# Patient Record
Sex: Female | Born: 1945 | Race: White | Hispanic: No | Marital: Married | Smoking: Former smoker
Health system: Southern US, Community
[De-identification: ages and names within clinical notes are randomized; demographics above are authoritative.]

## PROBLEM LIST (undated history)

## (undated) DIAGNOSIS — C679 Malignant neoplasm of bladder, unspecified: Secondary | ICD-10-CM

## (undated) DIAGNOSIS — N816 Rectocele: Secondary | ICD-10-CM

## (undated) DIAGNOSIS — I10 Essential (primary) hypertension: Secondary | ICD-10-CM

## (undated) HISTORY — DX: Essential (primary) hypertension: I10

## (undated) HISTORY — DX: Malignant neoplasm of bladder, unspecified: C67.9

## (undated) HISTORY — PX: APPENDECTOMY: SHX54

## (undated) HISTORY — DX: Rectocele: N81.6

## (undated) HISTORY — PX: OTHER SURGICAL HISTORY: SHX169

---

## 2008-06-28 ENCOUNTER — Ambulatory Visit: Payer: Self-pay | Admitting: Cardiology

## 2011-10-24 ENCOUNTER — Encounter (INDEPENDENT_AMBULATORY_CARE_PROVIDER_SITE_OTHER): Payer: Self-pay | Admitting: *Deleted

## 2011-11-07 ENCOUNTER — Ambulatory Visit (INDEPENDENT_AMBULATORY_CARE_PROVIDER_SITE_OTHER): Payer: Self-pay | Admitting: Internal Medicine

## 2015-11-08 ENCOUNTER — Ambulatory Visit (HOSPITAL_COMMUNITY)
Admission: RE | Admit: 2015-11-08 | Discharge: 2015-11-08 | Disposition: A | Discharge status: Home / Self Care | Payer: Commercial Managed Care - HMO | Source: Ambulatory Visit | Attending: Family Medicine | Admitting: Family Medicine

## 2015-11-08 ENCOUNTER — Other Ambulatory Visit (HOSPITAL_COMMUNITY): Payer: Self-pay | Admitting: Family Medicine

## 2015-11-08 DIAGNOSIS — M50322 Other cervical disc degeneration at C5-C6 level: Secondary | ICD-10-CM | POA: Diagnosis not present

## 2015-11-08 DIAGNOSIS — M542 Cervicalgia: Secondary | ICD-10-CM

## 2016-12-11 ENCOUNTER — Ambulatory Visit (INDEPENDENT_AMBULATORY_CARE_PROVIDER_SITE_OTHER): Payer: Medicare HMO | Admitting: Urology

## 2016-12-11 ENCOUNTER — Other Ambulatory Visit (HOSPITAL_COMMUNITY)
Admission: RE | Admit: 2016-12-11 | Discharge: 2016-12-11 | Disposition: A | Discharge status: Home / Self Care | Payer: Commercial Managed Care - HMO | Source: Ambulatory Visit | Attending: Urology | Admitting: Urology

## 2016-12-11 DIAGNOSIS — C678 Malignant neoplasm of overlapping sites of bladder: Secondary | ICD-10-CM | POA: Diagnosis present

## 2017-04-05 IMAGING — DX DG CERVICAL SPINE COMPLETE 4+V
5 series · 5 of 5 positions shown · non-contrast
Comparison: None.

CLINICAL DATA: Neck pain for the past 2 months after getting Bodrul Razvan
Ceejay.

EXAM:
CERVICAL SPINE - COMPLETE 4+ VIEW

[c-spine lat]
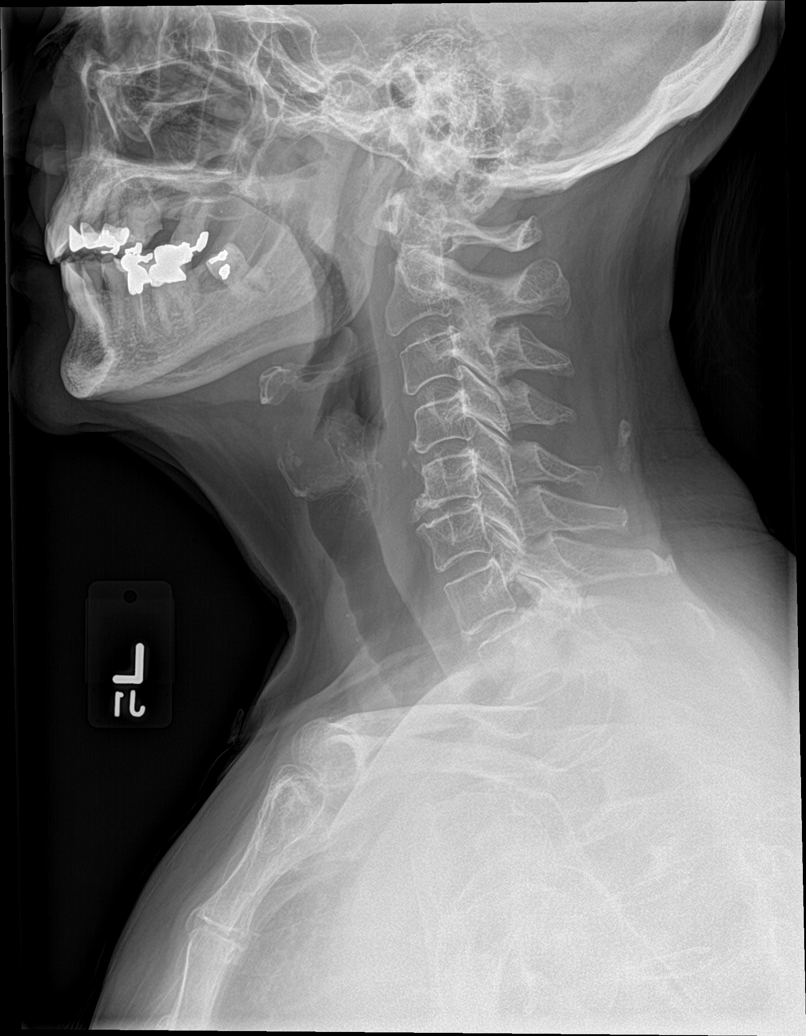

[c-spine obl (1 of 2)]
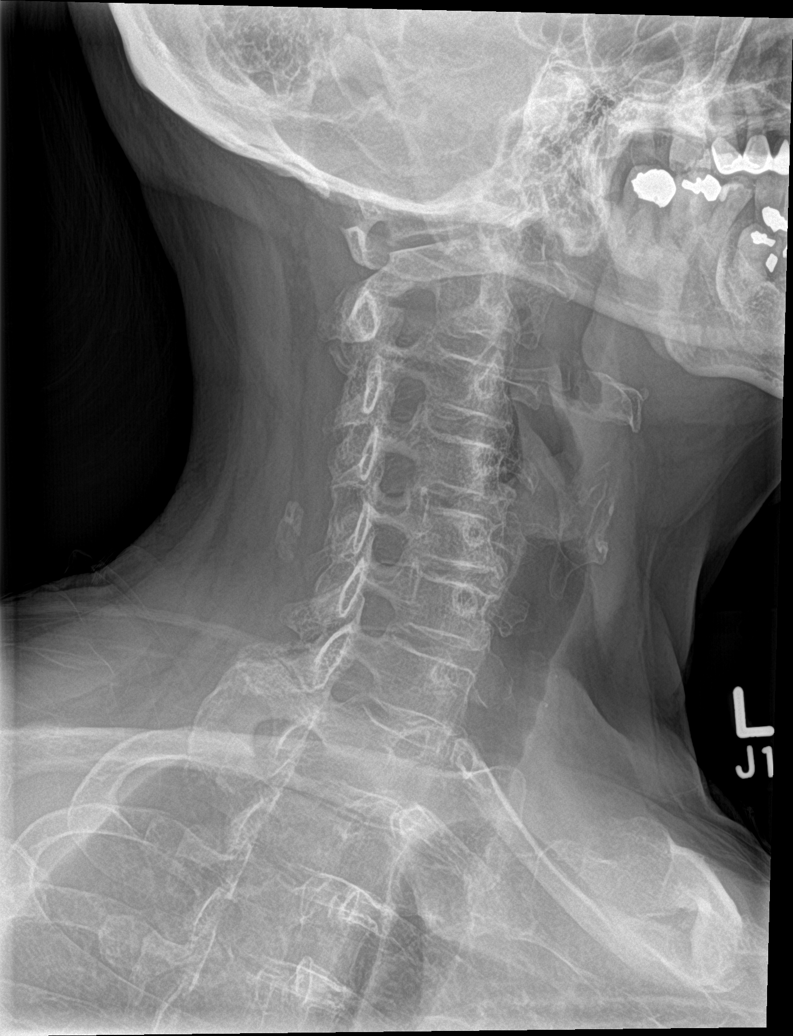

[c-spine obl (2 of 2)]
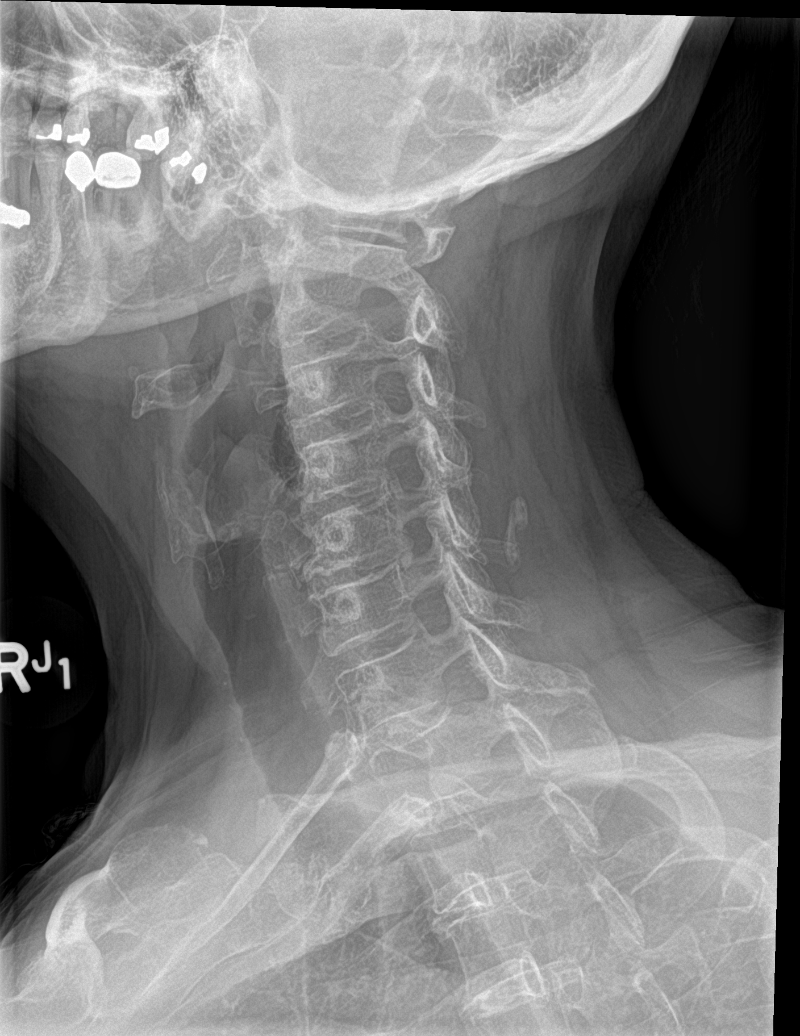

[c-spine ap]
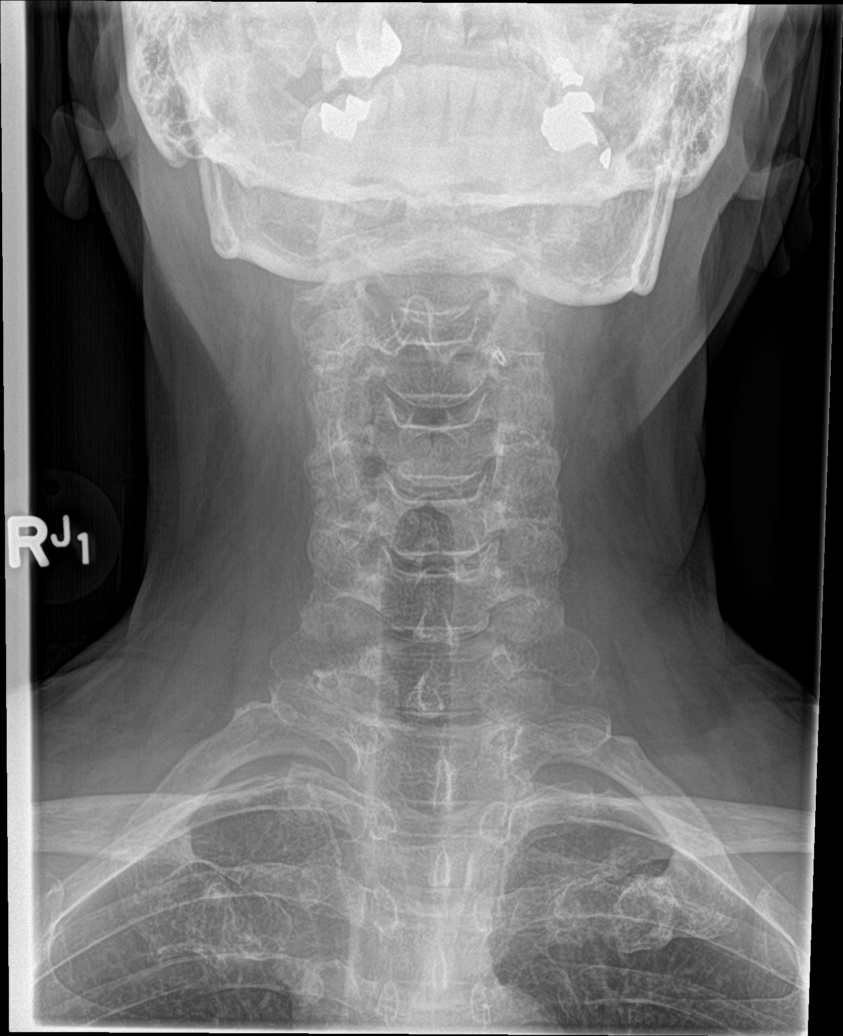

[c-spine open mouth]
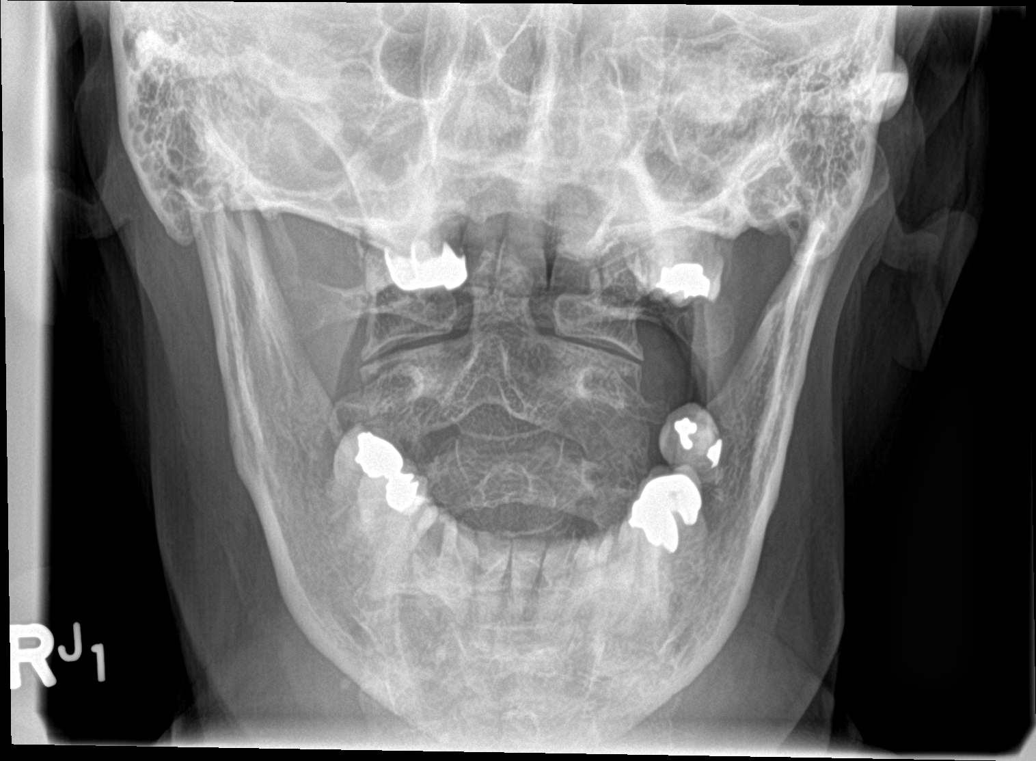

[5 of 5 positions shown; findings below may reference images not displayed]

FINDINGS: C1 to the superior endplate of T1 is imaged on the provided lateral
radiograph.

Normal alignment of the cervical spine. No anterolisthesis or
retrolisthesis. The bilateral neural foramina appear widely patent.
The dens is normally positioned between the lateral masses of C1.

Cervical vertebral body heights are preserved. Prevertebral soft
tissues are normal.

Mild to moderate multilevel cervical spine DDD, worse at C5-C6 and
to a lesser extent, C4-C5 with disc space height loss, endplate
irregularity and sclerosis.

There is partial ossification of the nuchal ligament posterior to
the C4 and C5 spinous processes. Regional soft tissues appear
otherwise normal.

Limited visualization of the lung apices is normal.
IMPRESSION: 1. No acute findings.
2. Mild-to-moderate multilevel cervical spine DDD, worse at C5-C6.

## 2018-05-01 ENCOUNTER — Ambulatory Visit: Payer: Medicare PPO | Admitting: Adult Health

## 2018-05-01 ENCOUNTER — Encounter: Payer: Self-pay | Admitting: Adult Health

## 2018-05-01 VITALS — BP 179/72 | HR 66 | Ht 63.25 in | Wt 180.0 lb

## 2018-05-01 DIAGNOSIS — N898 Other specified noninflammatory disorders of vagina: Secondary | ICD-10-CM | POA: Diagnosis not present

## 2018-05-01 DIAGNOSIS — L900 Lichen sclerosus et atrophicus: Secondary | ICD-10-CM | POA: Diagnosis not present

## 2018-05-01 MED ORDER — CLOBETASOL PROPIONATE 0.05 % EX CREA: TOPICAL_CREAM | CUTANEOUS | 3 refills | Status: DC

## 2018-05-01 NOTE — Patient Instructions (Signed)
Lichen Sclerosus  Lichen sclerosus is a skin problem. It can happen on any part of the body, but it commonly involves the anal or genital areas. It can cause itching and discomfort in these areas. Treatment can help to control symptoms. When the genital area is affected, getting treatment is important because the condition can cause scarring that may lead to other problems.  What are the causes?  The cause of this condition is not known. It could be the result of an overactive immune system or a lack of certain hormones. Lichen sclerosus is not an infection or a fungus. It is not passed from one person to another (not contagious).  What increases the risk?  This condition is more likely to develop in women, usually after menopause.  What are the signs or symptoms?  Symptoms of this condition include:  · Thin, wrinkled, white areas on the skin.  · Thickened white areas on the skin.  · Red and swollen patches (lesions) on the skin.  · Tears or cracks in the skin.  · Bruising.  · Blood blisters.  · Severe itching.    You may also have pain, itching, or burning with urination. Constipation is also common in people with lichen sclerosus.  How is this diagnosed?  This condition may be diagnosed with a physical exam. In some cases, a tissue sample (biopsy sample) may be removed to be looked at under a microscope.  How is this treated?  This condition is usually treated with medicated creams or ointments (topical steroids) that are applied over the affected areas.  Follow these instructions at home:  · Take over-the-counter and prescription medicines only as told by your health care provider.  · Use creams or ointments as told by your health care provider.  · Do not scratch the affected areas of skin.  · Women should keep the vaginal area as clean and dry as possible.  · Keep all follow-up visits as told by your health care provider. This is important.  Contact a health care provider if:  · You have increasing redness,  swelling, or pain in the affected area.  · You have fluid, blood, or pus coming from the affected area.  · You have new lesions on your skin.  · You have a fever.  · You have pain during sex.  This information is not intended to replace advice given to you by your health care provider. Make sure you discuss any questions you have with your health care provider.  Document Released: 12/12/2010 Document Revised: 12/28/2015 Document Reviewed: 10/17/2014  Elsevier Interactive Patient Education © 2018 [employer]

## 2018-05-01 NOTE — Progress Notes (Signed)
  Subjective:     Patient ID: Jody Adams, female   DOB: 21-Oct-1945, 72 y.o.   MRN: [id number]  HPI Jody Adams is a 72 year old white female, PM in complaining of vaginal itching for about 2 months. PCP is Dr Karie Kirks.   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 Reviewed medications and allergies.     Objective:   Physical Exam BP (!) 177/86 (BP Location: Left Arm, Patient Position: Sitting, Cuff Size: Normal)   Pulse 66   Ht 5' 3.25" (1.607 m)   Wt 180 lb (81.6 kg)   BMI 31.63 kg/m PHQ 2 score .BP recheck 179/72.  Skin warm and dry.Pelvic: external genitalia, has thin dry white area, at top of clitoral area, vagina:pale with loss of moisture and rugae,has thin urethra has no lesions or masses noted, cervix:smooth, uterus: normal size, shape and contour, non tender, no masses felt, adnexa: no masses or tenderness noted. Bladder is non tender and no masses felt.   On rectal exam hs good tone, +rectocele. F/U with Dr Karie Kirks in 1 month, and get BP rechecked, she has it is white coat syndrome.  Assessment:     1. Lichen sclerosus et atrophicus   2. Vaginal itching       Plan:     Meds ordered this encounter  Medications  . clobetasol cream (TEMOVATE) 0.05 %    Sig: Use bid for 2 weeks then 3 x weekly    Dispense:  30 g    Refill:  3    Order Specific Question:   Supervising Provider    Answer:   Tania Ade H [[id number]]  Recheck in 4 weeks, if not better will get biopsy Review handout on Lichen sclerosus, and showed pictures in [facility].  Try eating 1/2 cup applesauce, 1/2 cup oatmeal and 1/2 prune juice every day tp keep stool soft.

## 2018-05-29 ENCOUNTER — Ambulatory Visit: Payer: Medicare PPO | Admitting: Adult Health

## 2018-05-29 ENCOUNTER — Encounter: Payer: Self-pay | Admitting: Adult Health

## 2018-05-29 VITALS — BP 172/81 | HR 71 | Ht 63.5 in | Wt 180.0 lb

## 2018-05-29 DIAGNOSIS — L900 Lichen sclerosus et atrophicus: Secondary | ICD-10-CM

## 2018-05-29 NOTE — Progress Notes (Signed)
  Subjective:     Patient ID: Jody Adams, female   DOB: April 22, 1946, 72 y.o.   MRN: [id number]  HPI Jody Adams is a 72 year old white female, back in follow up on vaginal itching.and it has resolved. PCP is Dr Karie Kirks.   Review of Systems Itching stopped after 2 days of using temovate, and skin feels smoother Reviewed past medical,surgical, social and family history. Reviewed medications and allergies.     Objective:   Physical Exam BP (!) 172/81 (BP Location: Left Arm, Patient Position: Sitting, Cuff Size: Normal)   Pulse 71   Ht 5' 3.5" (1.613 m)   Wt 180 lb (81.6 kg)   BMI 31.39 kg/m   Skin warm and dry, the area that was white at top of clitoris has resolved and skin is smoother.The tissues are thin. Continue temovate 2-3 x weekly just a thin layer.     Assessment:    LSA    Plan:     Continue temovate 2-3 x weekly F/U in 10 weeks

## 2018-08-07 ENCOUNTER — Ambulatory Visit: Payer: Medicare PPO | Admitting: Adult Health

## 2018-08-12 ENCOUNTER — Encounter: Payer: Self-pay | Admitting: Adult Health

## 2018-08-12 ENCOUNTER — Ambulatory Visit (INDEPENDENT_AMBULATORY_CARE_PROVIDER_SITE_OTHER): Payer: Medicare Other | Admitting: Adult Health

## 2018-08-12 VITALS — BP 160/87 | HR 68 | Ht 63.5 in | Wt 185.0 lb

## 2018-08-12 DIAGNOSIS — L900 Lichen sclerosus et atrophicus: Secondary | ICD-10-CM

## 2018-08-12 NOTE — Progress Notes (Signed)
Patient ID: Jody Adams, female   DOB: 1946/07/27, 73 y.o.   MRN: [id number] History of Present Illness:  Jody Adams is a 73 year old white female, back in follow up on using temovate for LSA and has not itching. PCP is now Dr Harmon Pier, changed insurance, was Dr Karie Kirks.   Current Medications, Allergies, Past Medical History, Past Surgical History, Family History and Social History were reviewed in [employer] record.     Review of Systems: Has no itching now in vaginal area    Physical Exam:BP (!) 160/87 (BP Location: Left Arm, Patient Position: Sitting, Cuff Size: Normal)   Pulse 68   Ht 5' 3.5" (1.613 m)   Wt 185 lb (83.9 kg)   BMI 32.26 kg/m  General:  Well developed, well nourished, no acute distress Skin:  Warm and dry Pelvic:  External genitalia is normal in appearance, no lesions.No white areas, skin is thin,  The vagina is pale with loss of moisture and rugae. Urethra has no lesions or masses. The cervix is smooth.  Uterus is felt to be normal size, shape, and contour.  No adnexal masses or tenderness noted.Bladder is non tender, no masses felt. Psych:  No mood changes, alert and cooperative,seems happy Fall risk is low. Examination chaperoned by Estill Bamberg Rash LPN. Continue temovate 2-3 x weekly.  Impression: LSA    Plan: Continue thin layer of temovate 2-3  X weekly, has refills F/U in 6 months or sooner if needed

## 2018-12-03 ENCOUNTER — Telehealth: Payer: Self-pay | Admitting: Obstetrics & Gynecology

## 2018-12-03 ENCOUNTER — Telehealth: Payer: Self-pay | Admitting: Adult Health

## 2018-12-03 MED ORDER — NITROFURANTOIN MONOHYD MACRO 100 MG PO CAPS: 100.0000 mg | ORAL_CAPSULE | Freq: Two times a day (BID) | ORAL | 0 refills | Status: AC

## 2018-12-03 NOTE — Telephone Encounter (Signed)
Patient called, thinks she has a UTI, having a little pain when she urinates.    [facility] on [address]  [phone]

## 2018-12-03 NOTE — Telephone Encounter (Signed)
macrobid e prescribed

## 2020-10-03 ENCOUNTER — Other Ambulatory Visit: Payer: Self-pay | Admitting: Adult Health
